# Patient Record
Sex: Female | Born: 1995 | Race: White | Hispanic: No | Marital: Single | Smoking: Never smoker
Health system: Southern US, Community
[De-identification: ages and names within clinical notes are randomized; demographics above are authoritative.]

## PROBLEM LIST (undated history)

## (undated) DIAGNOSIS — R7303 Prediabetes: Secondary | ICD-10-CM

## (undated) HISTORY — DX: Prediabetes: R73.03

---
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
## 2014-06-03 ENCOUNTER — Emergency Department (HOSPITAL_COMMUNITY)
Admission: EM | Admit: 2014-06-03 | Discharge: 2014-06-04 | Disposition: A | Discharge status: Home / Self Care | Payer: Medicaid Other | Attending: Emergency Medicine | Admitting: Emergency Medicine

## 2014-06-03 ENCOUNTER — Encounter (HOSPITAL_COMMUNITY): Payer: Self-pay | Admitting: Emergency Medicine

## 2014-06-03 DIAGNOSIS — J069 Acute upper respiratory infection, unspecified: Secondary | ICD-10-CM | POA: Insufficient documentation

## 2014-06-03 LAB — RAPID STREP SCREEN (MED CTR MEBANE ONLY): STREPTOCOCCUS, GROUP A SCREEN (DIRECT): NEGATIVE

## 2014-06-03 MED ORDER — BENZONATATE 100 MG PO CAPS: 100.0000 mg | ORAL_CAPSULE | Freq: Three times a day (TID) | ORAL | Status: DC | PRN

## 2014-06-03 NOTE — ED Provider Notes (Signed)
CSN: [id number]     Arrival date & time 06/03/14  2139 History   First MD Initiated Contact with Patient 06/03/14 2243     Chief Complaint  Patient presents with  . Sore Throat      HPI Pt was seen at 2240.  Per pt, c/o gradual onset and persistence of constant sore throat, runny/stuffy nose, sinus congestion, and cough for the past 2-3 days.  Multiple others in household with same symptoms. Denies objective fever, no rash, no CP/SOB, no N/V/D, no abd pain.     History reviewed. No pertinent past medical history.  History reviewed. No pertinent past surgical history.  History  Substance Use Topics  . Smoking status: Never Smoker   . Smokeless tobacco: Not on file  . Alcohol Use: No    Review of Systems ROS: Statement: All systems negative except as marked or noted in the HPI; Constitutional: Negative for fever and +chills. ; ; Eyes: Negative for eye pain, redness and discharge. ; ; ENMT: Negative for ear pain, hoarseness, +nasal congestion, sinus pressure and sore throat. ; ; Cardiovascular: Negative for chest pain, palpitations, diaphoresis, dyspnea and peripheral edema. ; ; Respiratory: +cough. Negative for wheezing and stridor. ; ; Gastrointestinal: Negative for nausea, vomiting, diarrhea, abdominal pain, blood in stool, hematemesis, jaundice and rectal bleeding. . ; ; Genitourinary: Negative for dysuria, flank pain and hematuria. ; ; Musculoskeletal: Negative for back pain and neck pain. Negative for swelling and trauma.; ; Skin: Negative for pruritus, rash, abrasions, blisters, bruising and skin lesion.; ; Neuro: Negative for headache, lightheadedness and neck stiffness. Negative for weakness, altered level of consciousness , altered mental status, extremity weakness, paresthesias, involuntary movement, seizure and syncope.        Allergies  Review of patient's allergies indicates no known allergies.  Home Medications   Prior to Admission medications   Medication Sig Start  Date End Date Taking? Authorizing Provider  dextromethorphan-guaiFENesin (MUCINEX DM) 30-600 MG per 12 hr tablet Take 1 tablet by mouth daily as needed for cough.   Yes Historical Provider, MD  Triprolidine-Pseudoephedrine (COLD/ALLERGY/SINUS PO) Take 1-2 tablets by mouth daily as needed (for cold symptoms).   Yes Historical Provider, MD   BP 128/89  Pulse 92  Temp(Src) 97.9 F (36.6 C) (Oral)  Resp 24  Wt 232 lb 4.8 oz (105.371 kg)  SpO2 98%  LMP 05/13/2014 Physical Exam 2245: Physical examination:  Nursing notes reviewed; Vital signs and O2 SAT reviewed;  Constitutional: Well developed, Well nourished, Well hydrated, In no acute distress; Head:  Normocephalic, atraumatic; Eyes: EOMI, PERRL, No scleral icterus; ENMT: TM's clear bilat. +edemetous nasal turbinates bilat with clear rhinorrhea. Mouth and pharynx without lesions. +mild erythema to posterior pharynx with post nasal drip. No tonsillar exudates. No intra-oral edema. No submandibular or sublingual edema. No hoarse voice, no drooling, no stridor. No pain with manipulation of larynx. No trismus. Mouth and pharynx normal, Mucous membranes moist; Neck: Supple, Full range of motion, No lymphadenopathy; Cardiovascular: Regular rate and rhythm, No murmur, rub, or gallop; Respiratory: Breath sounds clear & equal bilaterally, No rales, rhonchi, wheezes.  Speaking full sentences with ease, Normal respiratory effort/excursion; Chest: Nontender, Movement normal; Abdomen: Soft, Nontender, Nondistended, Normal bowel sounds; Genitourinary: No CVA tenderness; Extremities: Pulses normal, No tenderness, No edema, No calf edema or asymmetry.; Neuro: AA&Ox3, Major CN grossly intact.  Speech clear. No gross focal motor or sensory deficits in extremities.; Skin: Color normal, Warm, Dry.   ED Course  Procedures  MDM  MDM Reviewed: previous chart, nursing note and vitals Interpretation: labs    Results for orders placed during the hospital encounter  of 06/03/14  RAPID STREP SCREEN      Result Value Ref Range   Streptococcus, Group A Screen (Direct) NEGATIVE  NEGATIVE     2340:  Strep test negative. Will tx symptomatically at this time. Dx and testing d/w pt.  Questions answered.  Verb understanding, agreeable to d/c home with outpt f/u.     Laray AngerKathleen M Shenoa Hattabaugh, DO 06/06/14 1614

## 2014-06-03 NOTE — Discharge Instructions (Signed)
°Emergency Department Resource Guide °1) Find a Doctor and Pay Out of Pocket °Although you won't have to find out who is covered by your insurance plan, it is a good idea to ask around and get recommendations. You will then need to call the office and see if the doctor you have chosen will accept you as a new patient and what types of options they offer for patients who are self-pay. Some doctors offer discounts or will set up payment plans for their patients who do not have insurance, but you will need to ask so you aren't surprised when you get to your appointment. ° °2) Contact Your Local Health Department °Not all health departments have doctors that can see patients for sick visits, but many do, so it is worth a call to see if yours does. If you don't know where your local health department is, you can check in your phone book. The CDC also has a tool to help you locate your state's health department, and many state websites also have listings of all of their local health departments. ° °3) Find a Walk-in Clinic °If your illness is not likely to be very severe or complicated, you may want to try a walk in clinic. These are popping up all over the country in pharmacies, drugstores, and shopping centers. They're usually staffed by nurse practitioners or physician assistants that have been trained to treat common illnesses and complaints. They're usually fairly quick and inexpensive. However, if you have serious medical issues or chronic medical problems, these are probably not your best option. ° °No Primary Care Doctor: °- Call [facility] Connect at  832-8000 - they can help you locate a primary care doctor that  accepts your insurance, provides certain services, etc. °- Physician Referral Service- 1-800-533-3463 ° °Chronic Pain Problems: °Organization         Address  Phone   Notes  °[facility]  (336) [phone] Patients need to be referred by their primary care doctor.  ° °Medication  Assistance: °Organization         Address  Phone   Notes  °[place] [facility] [address] °[place], [place] [address] (336) 641-8030 --Must be a resident of [place] °-- Must have NO insurance coverage whatsoever (no Medicaid/ Medicare, etc.) °-- The pt. MUST have a primary care doctor that directs their care regularly and follows them in the community °  °[facility]  (866) 331-1348   °[employer]  (888) 892-1162   ° °Agencies that provide inexpensive medical care: °Organization         Address  Phone   Notes  °[facility]  (336) 832-8035   °[facility]    (336) 832-7272   °[facility] [address] °[place], [place] [address] (336) 832-4777   °[facility] [address], °[place] (336) 271-4999   °[facility]    (336) 373-0678   °[facility]    (336) 272-1050   °[facility] ° [address], [place] Phone:  (336) 832-4444, Fax:  (336) 832-4440 Hours of Operation:  9 am - 6 pm, M-F.  Also accepts Medicaid/Medicare and self-pay.  °[facility] ° [address], [place] Phone: (336) 832-3150, Fax: (336) [phone]. Hours of Operation:  8:30 am - 5:30 pm, M-F.  Also accepts Medicaid and self-pay.  °[facility] [place] [address], [place] Phone: (336) 878-6027   °[facility] [address], [place], [place] (336)723-1848, Ext. 123 Mondays & Thursdays: 7-9 AM.  First 15 patients are seen on a first come, first serve basis. °  ° °Medicaid-accepting [place] Providers: ° °Organization         Address  Phone   Notes  °[facility] [address], [place] (336) 641-2100 Also accepts self-pay patients.  °[facility] [address], [place] ° (336) 856-9996   °[facility] [address], [place]  (336) 288-8857   °[facility] [address] [place] [address], [place] (336) 299-7000   °Veita Bland [address], [place]  ° (336) 373-1557 Only accepts [place] Access Medicaid patients after they have their name applied to their card.  ° °Self-Pay (no insurance) in [place]: ° °Organization         Address  Phone   Notes  °Sickle Cell Patients, [facility] [address], [place] (336) 832-1970   °[facility] [address], [place] (336) 832-4400   °[facility] ° [address], [place] (336) 992-4800   °[facility]/Dr. Osei-Bonsu ° [address] [place] [address], [place] or [address], [place] (336) [phone] Phone number for both [place] and [place] locations is the same.  °[facility] [address], [place] (336) 299-0000   °[facility] [address] [place] [address], [place] or [address] (336) 852-7530 °(336) 878-2260   °[facility] [address], [place] (336) 350-1642, phone; (336) 294-5005, fax Sees patients 1st and 3rd Saturday of every month.  Must not qualify for public or private insurance (i.e. Medicaid, Medicare, [place] Health Choice, Veterans' Benefits) • Household income should be no more than 200% of the poverty level •The clinic cannot treat you if you are pregnant or think you are pregnant • Sexually transmitted diseases are not treated at the clinic.  ° ° °Dental Care: °Organization         Address  Phone  Notes  °[place] [facility] [address], [place] (336) 641-6152 Accepts children up to age 21 who are enrolled in Medicaid or [place] Health Choice; pregnant women with a Medicaid card; and children who have applied for Medicaid or [place] Health Choice, but were declined, whose parents can pay a reduced fee at time of service.  °[place]  [facility] [place]  [address], [place] (336) 641-7733 Accepts children up to age 21 who are enrolled in Medicaid or [place] Health Choice; pregnant women with a Medicaid card; and children who have applied for Medicaid or [place] Health Choice, but were declined, whose parents can pay a reduced fee at time of service.  °[facility] ° [address], [place] (336) 641-4533 Patients are seen by appointment only. Walk-ins are not accepted. [facility] will see patients 18 years of age and older. °Monday - Tuesday (8am-5pm) °Most Wednesdays (8:30-5pm) °$30 per visit, cash only  °[facility] ° [address], [place] (336) 641-4533 Patients are seen by appointment only. Walk-ins are not accepted. [facility] will see patients 18 years of age and older. °One   Wednesday Evening (Monthly: Volunteer Based).  $30 per visit, cash only  °[facility]  (919) 537-3737 for adults; Children under age 4, call Graduate Pediatric Dentistry at ([phone]) [phone]. Children aged 4-14, please call (919) [phone] to request a pediatric application. ° Dental services are provided in all areas of dental care including fillings, crowns and bridges, complete and partial dentures, implants, gum treatment, root canals, and extractions. Preventive care is also provided. Treatment is provided to both adults and children. °Patients are selected via a lottery and there is often a waiting list. °  °[facility] [address], °[place] ° (336) 763-8833 [email or web address] °  °[facility] [address], [place], [place] (336)723-1848, Ext. 123 Second and Fourth Thursday of each month, opens at 6:30 AM; Clinic ends at 9 AM.  Patients are seen on a first-come first-served basis, and a limited number are seen during each clinic.  ° °[facility] ° [address], [place], [place] (336) 723-7904    Eligibility Requirements °You must have lived in [place], [place], or [place] counties for at least the last three months. °  You cannot be eligible for state or federal sponsored healthcare insurance, including Veterans Administration, Medicaid, or Medicare. °  You generally cannot be eligible for healthcare insurance through your employer.  °  How to apply: °Eligibility screenings are held every Tuesday and Wednesday afternoon from 1:00 pm until 4:00 pm. You do not need an appointment for the interview!  °[facility] [address], [place], [place] 336-631-2330   °[facility]  336-342-8273   °[facility]  336-703-3100   °[facility]  336-570-6415   ° °Behavioral Health Resources in the Community: °Intensive Outpatient Programs °Organization         Address  Phone  Notes  °[facility] [address], [place], [place] 336-878-6098   °[facility] [address], [place], [place] 336-832-9800   °[facility] [address], [place], [place] ° 336-882-2125   °[facility] [address],  °[place], [place] 1-800-853-5163 or 336-641-4981   °Substance Abuse Resources °Organization         Address  Phone  Notes  °[facility]  336-882-2125   °[facility]  336-784-9470   °[facility]  336-285-9073   °[facility]  336-845-3988   °Residential & Outpatient Substance Abuse Program  1-800-659-3381   °Psychological Services °Organization         Address  Phone  Notes  °[facility]  336- 832-9600   °[facility]  336- 378-7881   °[facility] [address], [place] 1-800-853-5163 or 336-641-4981   ° °Mobile Crisis Teams °Organization         Address  Phone  Notes  °[facility], Mobile Crisis Care Unit  1-877-626-1772   °Assertive °[facility] ° [address]  [place], [place] 336-834-9664   °Sharon DeEsch [address] °[place] 336-554-5454   ° °Self-Help/Support Groups °Organization         Address  Phone             Notes  °[facility] of  - variety of support groups  336- 373-1402 Call for more information  °Narcotics Anonymous (NA), [facility] [address], °[place]  2 meetings at this location  ° °  Residential Treatment Programs Organization         Address  Phone  Notes  [facility] [address],    [place]  1-610-960-45401-(313) 790-6672   [facility]  [address], [place] [phone], [place], [place] [phone]   [facility] [address], [place] [phone] Admissions: 8am-3pm M-F  [facility] [address]    [place], [place] [phone]([phone])[phone]   [facility] [address], [place], [place] [phone]   [facility] [address]  [place], [place] [phone]   [facility] Int[facility]patient [address], [place], [place] [phone]   [facility] [address]  [place], [place] [phone] or [phone]   [facility] [address] [place], [place] 323-557-3220([phone]) [phone] Accepts Medicaid  [facility] [address]  [place] [phone] Substance Abuse/Addiction Treatment   [facility] Resources Organization         Address  Phone  Notes  [facility]  864-775-4903(888) [phone]   Angie FavaJulie Brannon, PhD [address], [address] [place], [place]   [phone](336) [phone] or [phone](336) [phone]   [facility]    [address] [place], [place] [phone](336) [phone]   [facility] [address], [place], [place] [phone](336) [phone] Insurance/Medicaid/sponsorship through [facility]  [facility] [address]                                    [place], [place] [phone](336) [phone] Therapy/tele-psych/case    [facility] [address]   [place], [place] [phone]) [phone]    Dr. Lolly MustacheArfeen  [phone](336) [phone]   [facility]  [employer] [facility] 1) [address], [place] 2) [address], [place] 3)  [address], [place] [phone](336) [phone](336) [phone]  [phone](336) [phone]   [facility] Child Abuse Hotline 403-420-0891(336) 7431369280 or [phone](336) [phone] (After Hours)      Take over the counter tylenol and ibuprofen, as directed on packaging, as needed for discomfort.  Gargle with warm water several times per day to help with discomfort.  May also use over the counter sore throat pain medicines such as chloraseptic or sucrets, as directed on packaging, as needed for discomfort.  Take over the counter decongestant (such as sudafed), as directed on packaging, for the next week.  Use over the counter normal saline nasal spray, as instructed in the Emergency Department, several times per day for the next 2 weeks.  Call your regular medical doctor tomorrow to schedule a follow up appointment this week.  Return to the Emergency Department immediately if worsening.

## 2014-06-03 NOTE — ED Notes (Signed)
Patient c/o generalized body aches, cough, and sore throat x 3 days.

## 2014-06-04 NOTE — ED Notes (Signed)
Pt left without signing discharge or receiving prescriptions or discharge instructions.

## 2014-06-05 LAB — CULTURE, GROUP A STREP

## 2014-06-06 ENCOUNTER — Emergency Department (HOSPITAL_COMMUNITY)
Admission: EM | Admit: 2014-06-06 | Discharge: 2014-06-06 | Disposition: A | Discharge status: Home / Self Care | Payer: Medicaid Other | Attending: Emergency Medicine | Admitting: Emergency Medicine

## 2014-06-06 ENCOUNTER — Encounter (HOSPITAL_COMMUNITY): Payer: Self-pay | Admitting: Emergency Medicine

## 2014-06-06 DIAGNOSIS — Z791 Long term (current) use of non-steroidal anti-inflammatories (NSAID): Secondary | ICD-10-CM | POA: Insufficient documentation

## 2014-06-06 DIAGNOSIS — H6691 Otitis media, unspecified, right ear: Secondary | ICD-10-CM

## 2014-06-06 DIAGNOSIS — Z79899 Other long term (current) drug therapy: Secondary | ICD-10-CM | POA: Insufficient documentation

## 2014-06-06 DIAGNOSIS — H6690 Otitis media, unspecified, unspecified ear: Secondary | ICD-10-CM | POA: Insufficient documentation

## 2014-06-06 DIAGNOSIS — Z792 Long term (current) use of antibiotics: Secondary | ICD-10-CM | POA: Insufficient documentation

## 2014-06-06 DIAGNOSIS — H109 Unspecified conjunctivitis: Secondary | ICD-10-CM | POA: Insufficient documentation

## 2014-06-06 MED ORDER — TOBRAMYCIN 0.3 % OP SOLN
2.0000 [drp] | Freq: Once | OPHTHALMIC | Status: AC
  Administered 2014-06-06: 2 [drp] via OPHTHALMIC
  Filled 2014-06-06: qty 5

## 2014-06-06 MED ORDER — IBUPROFEN 800 MG PO TABS: 800.0000 mg | ORAL_TABLET | Freq: Three times a day (TID) | ORAL | Status: DC

## 2014-06-06 MED ORDER — IBUPROFEN 800 MG PO TABS
800.0000 mg | ORAL_TABLET | Freq: Once | ORAL | Status: AC
  Administered 2014-06-06: 800 mg via ORAL
  Filled 2014-06-06: qty 1

## 2014-06-06 MED ORDER — HYDROCODONE-ACETAMINOPHEN 5-325 MG PO TABS
1.0000 | ORAL_TABLET | Freq: Once | ORAL | Status: AC
  Administered 2014-06-06: 1 via ORAL
  Filled 2014-06-06: qty 1

## 2014-06-06 MED ORDER — PENICILLIN V POTASSIUM 250 MG PO TABS
500.0000 mg | ORAL_TABLET | Freq: Once | ORAL | Status: AC
  Administered 2014-06-06: 500 mg via ORAL
  Filled 2014-06-06: qty 2

## 2014-06-06 MED ORDER — AMOXICILLIN 500 MG PO CAPS: 500.0000 mg | ORAL_CAPSULE | Freq: Three times a day (TID) | ORAL | Status: DC

## 2014-06-06 MED ORDER — ONDANSETRON HCL 4 MG PO TABS
4.0000 mg | ORAL_TABLET | Freq: Once | ORAL | Status: AC
  Administered 2014-06-06: 4 mg via ORAL
  Filled 2014-06-06: qty 1

## 2014-06-06 MED ORDER — HYDROCODONE-ACETAMINOPHEN 5-325 MG PO TABS: 1.0000 | ORAL_TABLET | ORAL | Status: DC | PRN

## 2014-06-06 NOTE — ED Provider Notes (Signed)
CSN: [id number]     Arrival date & time 06/06/14  2143 History   First MD Initiated Contact with Patient 06/06/14 2149     Chief Complaint  Patient presents with  . Otalgia     (Consider location/radiation/quality/duration/timing/severity/associated sxs/prior Treatment) Patient is a 18 y.o. female presenting with ear pain. The history is provided by the patient.  Otalgia Location:  Right Behind ear:  No abnormality Quality:  Aching and pressure Severity:  Moderate Onset quality:  Gradual Timing:  Intermittent Progression:  Worsening Chronicity:  Recurrent Context: not direct blow and no water in ear   Relieved by:  Nothing Ineffective treatments:  None tried Associated symptoms: headaches   Associated symptoms: no abdominal pain, no cough, no fever, no neck pain and no vomiting   Risk factors: no recent travel     History reviewed. No pertinent past medical history. History reviewed. No pertinent past surgical history. History reviewed. No pertinent family history. History  Substance Use Topics  . Smoking status: Never Smoker   . Smokeless tobacco: Not on file  . Alcohol Use: No   OB History   Grav Para Term Preterm Abortions TAB SAB Ect Mult Living                 Review of Systems  Constitutional: Negative for fever and activity change.       All ROS Neg except as noted in HPI  HENT: Positive for ear pain. Negative for nosebleeds.   Eyes: Negative for photophobia and discharge.  Respiratory: Negative for cough, shortness of breath and wheezing.   Cardiovascular: Negative for chest pain and palpitations.  Gastrointestinal: Negative for vomiting, abdominal pain and blood in stool.  Genitourinary: Negative for dysuria, frequency and hematuria.  Musculoskeletal: Negative for arthralgias, back pain and neck pain.  Skin: Negative.   Neurological: Positive for headaches. Negative for dizziness, seizures and speech difficulty.  Psychiatric/Behavioral: Negative for  hallucinations and confusion.      Allergies  Review of patient's allergies indicates no known allergies.  Home Medications   Prior to Admission medications   Medication Sig Start Date End Date Taking? Authorizing Provider  dextromethorphan-guaiFENesin (MUCINEX DM) 30-600 MG per 12 hr tablet Take 1 tablet by mouth daily as needed for cough.   Yes Historical Provider, MD  DM-Doxylamine-Acetaminophen 15-6.25-325 MG/15ML LIQD Take 10-15 mLs by mouth at bedtime as needed (for cold).   Yes Historical Provider, MD  Triprolidine-Pseudoephedrine (COLD/ALLERGY/SINUS PO) Take 1-2 tablets by mouth daily as needed (for cold symptoms).   Yes Historical Provider, MD  amoxicillin (AMOXIL) 500 MG capsule Take 1 capsule (500 mg total) by mouth 3 (three) times daily. 06/06/14   Kathie DikeHobson M Zyah Gomm, PA-C  benzonatate (TESSALON) 100 MG capsule Take 1 capsule (100 mg total) by mouth 3 (three) times daily as needed for cough. 06/03/14   Laray AngerKathleen M McManus, DO  HYDROcodone-acetaminophen (NORCO/VICODIN) 5-325 MG per tablet Take 1 tablet by mouth every 4 (four) hours as needed for moderate pain. 06/06/14   Kathie DikeHobson M Antawan Mchugh, PA-C  ibuprofen (ADVIL,MOTRIN) 800 MG tablet Take 1 tablet (800 mg total) by mouth 3 (three) times daily. 06/06/14   Kathie DikeHobson M Tyreke Kaeser, PA-C   BP 126/80  Pulse 98  Temp(Src) 97.6 F (36.4 C) (Oral)  Resp 20  Ht 5\' 4"  (1.626 m)  Wt 232 lb (105.235 kg)  BMI 39.80 kg/m2  LMP 05/13/2014 Physical Exam  Nursing note and vitals reviewed. Constitutional: She is oriented to person, place, and time. She appears  well-developed and well-nourished.  Non-toxic appearance.  HENT:  Head: Normocephalic.  Right Ear: Tympanic membrane and external ear normal.  Left Ear: Tympanic membrane and external ear normal.  Patient has a cerumen impaction on the right. There is a partial cerumen impaction on left, with portion of the tympanic membrane seen is without problem. The extra auditory canals are clear.  There is  mild increased redness of the posterior pharynx. The uvula is in the midline. The speech is understandable.  There is nasal congestion present.  Eyes: EOM and lids are normal. Pupils are equal, round, and reactive to light.  There is increased redness of the conjunctiva of the right and the left eyes. There is some swelling of the conjunctiva of the lower lid.  Neck: Normal range of motion. Neck supple. Carotid bruit is not present.  Cardiovascular: Normal rate, regular rhythm, normal heart sounds, intact distal pulses and normal pulses.   Pulmonary/Chest: Breath sounds normal. No respiratory distress.  Abdominal: Soft. Bowel sounds are normal. There is no tenderness. There is no guarding.  Musculoskeletal: Normal range of motion.  Lymphadenopathy:       Head (right side): No submandibular adenopathy present.       Head (left side): No submandibular adenopathy present.    She has no cervical adenopathy.  Neurological: She is alert and oriented to person, place, and time. She has normal strength. No cranial nerve deficit or sensory deficit.  Skin: Skin is warm and dry.  Psychiatric: She has a normal mood and affect. Her speech is normal.    ED Course The right ear was irrigated by me with removal of significant  wax impaction.  After removal of the ear wax impaction, there is noted redness and some bulging of the tympanic membrane.   Procedures (including critical care time) Labs Review Labs Reviewed - No data to display  Imaging Review No results found.   EKG Interpretation None      MDM Vital signs are WNL. Exam is consistent with otitis media and conjunctivitis. Pt will be treated with tobramycin opthal drops, and amoxil, and norco. Pt to follow up with PCP.   Final diagnoses:  Acute right otitis media, recurrence not specified, unspecified otitis media type  Bilateral conjunctivitis    *I have reviewed nursing notes, vital signs, and all appropriate lab and imaging  results for this patient.Kathie Dike**    Verlene Glantz M Willie Loy, PA-C 06/08/14 0110

## 2014-06-06 NOTE — Discharge Instructions (Signed)
You have an ear infection on the right. Please use Amoxil and ibuprofen 3 times daily with food until all taken. Please use 2 tobramycin eyedrops every 4 hours for the next 5 days for your conjunctivitis. Please wash hands frequently. Please use dark glasses when in bright light for comfort. Use Norco for pain. This medication may cause drowsiness, please use with caution. Otitis Media Otitis media is redness, soreness, and swelling (inflammation) of the middle ear. Otitis media may be caused by allergies or, most commonly, by infection. Often it occurs as a complication of the common cold. SIGNS AND SYMPTOMS Symptoms of otitis media may include:  Earache.  Fever.  Ringing in your ear.  Headache.  Leakage of fluid from the ear. DIAGNOSIS To diagnose otitis media, your health care provider will examine your ear with an otoscope. This is an instrument that allows your health care provider to see into your ear in order to examine your eardrum. Your health care provider also will ask you questions about your symptoms. TREATMENT  Typically, otitis media resolves on its own within 3-5 days. Your health care provider may prescribe medicine to ease your symptoms of pain. If otitis media does not resolve within 5 days or is recurrent, your health care provider may prescribe antibiotic medicines if he or she suspects that a bacterial infection is the cause. HOME CARE INSTRUCTIONS   Take your medicine as directed until it is gone, even if you feel better after the first few days.  Only take over-the-counter or prescription medicines for pain, discomfort, or fever as directed by your health care provider.  Follow up with your health care provider as directed. SEEK MEDICAL CARE IF:  You have otitis media only in one ear, or bleeding from your nose, or both.  You notice a lump on your neck.  You are not getting better in 3-5 days.  You feel worse instead of better. SEEK IMMEDIATE MEDICAL CARE  IF:   You have pain that is not controlled with medicine.  You have swelling, redness, or pain around your ear or stiffness in your neck.  You notice that part of your face is paralyzed.  You notice that the bone behind your ear (mastoid) is tender when you touch it. MAKE SURE YOU:   Understand these instructions.  Will watch your condition.  Will get help right away if you are not doing well or get worse. Document Released: 09/10/2004 Document Revised: 12/11/2013 Document Reviewed: 07/03/2013 [facility]Exit[facility] Patient Information 2015 [place], [place]. This information is not intended to replace advice given to you by your health care provider. Make sure you discuss any questions you have with your health care provider.  Conjunctivitis Conjunctivitis is commonly called "pink eye." Conjunctivitis can be caused by bacterial or viral infection, allergies, or injuries. There is usually redness of the lining of the eye, itching, discomfort, and sometimes discharge. There may be deposits of matter along the eyelids. A viral infection usually causes a watery discharge, while a bacterial infection causes a yellowish, thick discharge. Pink eye is very contagious and spreads by direct contact. You may be given antibiotic eyedrops as part of your treatment. Before using your eye medicine, remove all drainage from the eye by washing gently with warm water and cotton balls. Continue to use the medication until you have awakened 2 mornings in a row without discharge from the eye. Do not rub your eye. This increases the irritation and helps spread infection. Use separate towels from other household  members. Wash your hands with soap and water before and after touching your eyes. Use cold compresses to reduce pain and sunglasses to relieve irritation from light. Do not wear contact lenses or wear eye makeup until the infection is gone. SEEK MEDICAL CARE IF:   Your symptoms are not better after 3 days of  treatment.  You have increased pain or trouble seeing.  The outer eyelids become very red or swollen. Document Released: 01/13/2005 Document Revised: 02/28/2012 Document Reviewed: 12/06/2005 [facility]ExitCare Patient Information 2015 [place]Care, [place]LL[place]. This information is not intended to replace advice given to you by your health care provider. Make sure you discuss any questions you have with your health care provider.

## 2014-06-06 NOTE — ED Notes (Signed)
My ear is hurting and my eyes are hurting per pt.

## 2014-06-12 NOTE — ED Provider Notes (Signed)
Medical screening examination/treatment/procedure(s) were performed by non-physician practitioner and as supervising physician I was immediately available for consultation/collaboration.   EKG Interpretation None      Devoria AlbeIva Athziri Freundlich, MD, Armando GangFACEP   Ward GivensIva L Milind Raether, MD 06/12/14 [phone]

## 2014-12-25 ENCOUNTER — Encounter (HOSPITAL_COMMUNITY): Payer: Self-pay | Admitting: Emergency Medicine

## 2014-12-25 ENCOUNTER — Emergency Department (HOSPITAL_COMMUNITY)
Admission: EM | Admit: 2014-12-25 | Discharge: 2014-12-25 | Disposition: A | Discharge status: Home / Self Care | Payer: Medicaid Other | Attending: Emergency Medicine | Admitting: Emergency Medicine

## 2014-12-25 DIAGNOSIS — L42 Pityriasis rosea: Secondary | ICD-10-CM | POA: Diagnosis not present

## 2014-12-25 DIAGNOSIS — Z792 Long term (current) use of antibiotics: Secondary | ICD-10-CM | POA: Diagnosis not present

## 2014-12-25 DIAGNOSIS — R21 Rash and other nonspecific skin eruption: Secondary | ICD-10-CM | POA: Diagnosis present

## 2014-12-25 DIAGNOSIS — Z791 Long term (current) use of non-steroidal anti-inflammatories (NSAID): Secondary | ICD-10-CM | POA: Insufficient documentation

## 2014-12-25 MED ORDER — PREDNISONE 20 MG PO TABS: ORAL_TABLET | ORAL | Status: DC

## 2014-12-25 NOTE — ED Notes (Signed)
Patient states she has rash after wearing a new outfit she received for Christmas.   Patient states has had the rash x 1 week, but spreading.   Patient complains of raised, red rash over stomach, chest, neck and arm.

## 2014-12-25 NOTE — ED Provider Notes (Signed)
CSN: [id number]     Arrival date & time 12/25/14  1257 History  This chart was scribed for non-physician practitioner, Junius Finner, PA-C working with Glynn Octave, MD by Gwenyth Ober, ED scribe. This patient was seen in room TR09C/TR09C and the patient's care was started at 1:30 PM   Chief Complaint  Patient presents with  . Rash   The history is provided by the patient. No language interpreter was used.   HPI Comments: Heidi Turner is a 19 y.o. female who presents to the Emergency Department complaining of a constant, gradually worsening rash that started 1 week ago and is worst on her chest and neck. Pt notes intermittent itching an associated symptom. Her stepfather states the rash is becoming more vivid and is spreading to her stomach and arms. Pt denies new soaps, medicines and lotions. She denies a history of diabetes. Pt also denies fever as an associated symptom.  History reviewed. No pertinent past medical history. History reviewed. No pertinent past surgical history. No family history on file. History  Substance Use Topics  . Smoking status: Never Smoker   . Smokeless tobacco: Not on file  . Alcohol Use: No   OB History    No data available     Review of Systems  Constitutional: Negative for fever.  Skin: Positive for rash.  All other systems reviewed and are negative.     Allergies  Review of patient's allergies indicates no known allergies.  Home Medications   Prior to Admission medications   Medication Sig Start Date End Date Taking? Authorizing Provider  amoxicillin (AMOXIL) 500 MG capsule Take 1 capsule (500 mg total) by mouth 3 (three) times daily. 06/06/14   Kathie Dike, PA-C  benzonatate (TESSALON) 100 MG capsule Take 1 capsule (100 mg total) by mouth 3 (three) times daily as needed for cough. 06/03/14   Samuel Jester, DO  dextromethorphan-guaiFENesin [facility] DM) 30-600 MG per 12 hr tablet Take 1 tablet by mouth daily as needed for cough.     Historical Provider, MD  DM-Doxylamine-Acetaminophen 15-6.25-325 MG/15ML LIQD Take 10-15 mLs by mouth at bedtime as needed (for cold).    Historical Provider, MD  HYDROcodone-acetaminophen (NORCO/VICODIN) 5-325 MG per tablet Take 1 tablet by mouth every 4 (four) hours as needed for moderate pain. 06/06/14   Kathie Dike, PA-C  ibuprofen (ADVIL,MOTRIN) 800 MG tablet Take 1 tablet (800 mg total) by mouth 3 (three) times daily. 06/06/14   Kathie Dike, PA-C  predniSONE (DELTASONE) 20 MG tablet 3 tabs po day one, then 2 po daily x 4 days 12/25/14   Junius Finner, PA-C  Triprolidine-Pseudoephedrine (COLD/ALLERGY/SINUS PO) Take 1-2 tablets by mouth daily as needed (for cold symptoms).    Historical Provider, MD   BP 125/97 mmHg  Pulse 81  Temp(Src) 97.2 F (36.2 C) (Oral)  Resp 18  SpO2 100% Physical Exam  Constitutional: She is oriented to person, place, and time. She appears well-developed and well-nourished.  HENT:  Head: Normocephalic and atraumatic.  Eyes: EOM are normal.  Neck: Normal range of motion.  Cardiovascular: Normal rate.   Pulmonary/Chest: Effort normal.  Musculoskeletal: Normal range of motion.  Neurological: She is alert and oriented to person, place, and time.  Skin: Skin is warm and dry. Rash noted.  A diffuse red, patchy, scaly rash on trunk and back; rash is not present on some exposed skin of arms or legs; no induration or fluctuance; no evidence of underlying infection   Psychiatric: She  has a normal mood and affect. Her behavior is normal.  Nursing note and vitals reviewed.   ED Course  Procedures (including critical care time) DIAGNOSTIC STUDIES: Oxygen Saturation is 100% on RA, normal by my interpretation.    COORDINATION OF CARE: 1:34 PM Discussed treatment plan with pt at bedside and pt agreed to plan.  Labs Review Labs Reviewed - No data to display  Imaging Review No results found.   EKG Interpretation None      MDM   Final diagnoses:   Pityriasis rosea    Pt presenting to ED with rash c/w pityriasis rosea.   No evidence of underlying infection. Pt is afebrile. No respiratory distress. Will tx with prednisone dose pack. Advised pt may use OTC benadryl as needed for itching. Home care instructions provided. Advised to f/u with PCP.  Return precautions provided. Pt verbalized understanding and agreement with tx plan.   I personally performed the services described in this documentation, which was scribed in my presence. The recorded information has been reviewed and is accurate.    Junius Finnerrin O'Malley, PA-C 12/25/14 1620  Glynn OctaveStephen Rancour, MD 12/25/14 [phone]

## 2014-12-25 NOTE — Discharge Instructions (Signed)
Pityriasis Rosea  Pityriasis rosea is a rash which is probably caused by a virus. It generally starts as a scaly, red patch on the trunk (the area of the body that a t-shirt would cover) but does not appear on sun exposed areas. The rash is usually preceded by an initial larger spot called the "herald patch" a week or more before the rest of the rash appears. Generally within one to two days the rash appears rapidly on the trunk, upper arms, and sometimes the upper legs. The rash usually appears as flat, oval patches of scaly pink color. The rash can also be raised and one is able to feel it with a finger. The rash can also be finely crinkled and may slough off leaving a ring of scale around the spot. Sometimes a mild sore throat is present with the rash. It usually affects children and young adults in the spring and autumn. Women are more frequently affected than men.  TREATMENT   Pityriasis rosea is a self-limited condition. This means it goes away within 4 to 8 weeks without treatment. The spots may persist for several months, especially in darker-colored skin after the rash has resolved and healed. Benadryl and steroid creams may be used if itching is a problem.  SEEK MEDICAL CARE IF:   · Your rash does not go away or persists longer than three months.  · You develop fever and joint pain.  · You develop severe headache and confusion.  · You develop breathing difficulty, vomiting and/or extreme weakness.  Document Released: 01/12/2002 Document Revised: 02/28/2012 Document Reviewed: 01/31/2009  ExitCare® Patient Information ©2015 Exit[employer], LLC. This information is not intended to replace advice given to you by your health care provider. Make sure you discuss any questions you have with your health care provider.

## 2015-07-03 ENCOUNTER — Encounter: Payer: Self-pay | Admitting: Certified Nurse Midwife

## 2015-07-03 ENCOUNTER — Ambulatory Visit (INDEPENDENT_AMBULATORY_CARE_PROVIDER_SITE_OTHER): Payer: Medicaid Other | Admitting: Certified Nurse Midwife

## 2015-07-03 VITALS — BP 126/82 | HR 94 | Temp 97.6°F | Ht 68.0 in | Wt 240.0 lb

## 2015-07-03 DIAGNOSIS — Z113 Encounter for screening for infections with a predominantly sexual mode of transmission: Secondary | ICD-10-CM | POA: Diagnosis not present

## 2015-07-03 DIAGNOSIS — N760 Acute vaginitis: Secondary | ICD-10-CM | POA: Diagnosis not present

## 2015-07-03 DIAGNOSIS — E669 Obesity, unspecified: Secondary | ICD-10-CM | POA: Diagnosis not present

## 2015-07-03 DIAGNOSIS — Z0000 Encounter for general adult medical examination without abnormal findings: Secondary | ICD-10-CM

## 2015-07-03 DIAGNOSIS — N926 Irregular menstruation, unspecified: Secondary | ICD-10-CM | POA: Diagnosis not present

## 2015-07-03 DIAGNOSIS — B9689 Other specified bacterial agents as the cause of diseases classified elsewhere: Secondary | ICD-10-CM

## 2015-07-03 DIAGNOSIS — E782 Mixed hyperlipidemia: Secondary | ICD-10-CM | POA: Diagnosis not present

## 2015-07-03 DIAGNOSIS — R7303 Prediabetes: Secondary | ICD-10-CM

## 2015-07-03 DIAGNOSIS — R7309 Other abnormal glucose: Secondary | ICD-10-CM | POA: Diagnosis not present

## 2015-07-03 DIAGNOSIS — Z01419 Encounter for gynecological examination (general) (routine) without abnormal findings: Secondary | ICD-10-CM | POA: Diagnosis not present

## 2015-07-03 DIAGNOSIS — A499 Bacterial infection, unspecified: Secondary | ICD-10-CM | POA: Diagnosis not present

## 2015-07-03 MED ORDER — TINIDAZOLE 500 MG PO TABS: 2.0000 g | ORAL_TABLET | Freq: Every day | ORAL | Status: AC

## 2015-07-03 MED ORDER — TINIDAZOLE 500 MG PO TABS: 2.0000 g | ORAL_TABLET | Freq: Every day | ORAL | Status: DC

## 2015-07-03 NOTE — Progress Notes (Signed)
Patient ID: Heidi Turner, female   DOB: 1996-09-15, 19 y.o.   MRN: [id number]    Subjective:     Heidi Turner is a 19 y.o. female here for a routine exam.  Current complaints: irregular, 1-2 months between.  Menses last are light to heavy, denies any clots.  Has not had a period for several months.  Abilify for depression, does not see a Veterinary surgeon.      Personal health questionnaire:  Is patient Ashkenazi Jewish, have a family history of breast and/or ovarian cancer: MGM BCA dx around early 52's.   Is there a family history of uterine cancer diagnosed at age < 11, gastrointestinal cancer, urinary tract cancer, family member who is a Personnel officer syndrome-associated carrier: no Is the patient overweight and hypertensive, family history of diabetes, personal history of gestational diabetes, preeclampsia or PCOS: yes Is patient over 67, have PCOS,  family history of premature CHD under age 108, diabetes, smoke, have hypertension or peripheral artery disease:  Mom has DM At any time, has a partner hit, kicked or otherwise hurt or frightened you?: no Over the past 2 weeks, have you felt down, depressed or hopeless?: yes on medication Over the past 2 weeks, have you felt little interest or pleasure in doing things?:sometimes   Gynecologic History No LMP recorded (lmp unknown). Patient is not currently having periods (Reason: Irregular Periods). Contraception: none Last Pap: N/A.  Last mammogram: N/A.  Obstetric History OB History  Gravida Para Term Preterm AB SAB TAB Ectopic Multiple Living         Past Medical History  Diagnosis Date  . Prediabetes     History reviewed. No pertinent past surgical history.   Current outpatient prescriptions:  .  amoxicillin (AMOXIL) 500 MG capsule, Take 1 capsule (500 mg total) by mouth 3 (three) times daily. (Patient not taking: Reported on 07/03/2015), Disp: 21 capsule, Rfl: 0 .  tinidazole (TINDAMAX) 500 MG tablet, Take 4  tablets (2,000 mg total) by mouth daily with breakfast., Disp: 12 tablet, Rfl: 0 No Known Allergies  History  Substance Use Topics  . Smoking status: Never Smoker   . Smokeless tobacco: Not on file  . Alcohol Use: No    History reviewed. No pertinent family history.    Review of Systems  Constitutional: negative for fatigue and weight loss Respiratory: negative for cough and wheezing Cardiovascular: negative for chest pain, fatigue and palpitations Gastrointestinal: negative for abdominal pain and change in bowel habits Musculoskeletal:negative for myalgias Neurological: negative for gait problems and tremors Behavioral/Psych: negative for abusive relationship, + hx of depression in medication Endocrine: negative for temperature intolerance   Genitourinary:negative for genital lesions, hot flashes, sexual problems and vaginal discharge.  +abnormal menstrual periods. Integument/breast: negative for breast lump, breast tenderness, nipple discharge and skin lesion(s)    Objective:       BP 126/82 mmHg  Pulse 94  Temp(Src) 97.6 F (36.4 C)  Ht  (1.727 m)  Wt 240 lb (108.863 kg)  BMI 36.50 kg/m2  LMP  (LMP Unknown) General:   alert  Skin:   no rash or abnormalities, hirsutism breasts/face  Lungs:   clear to auscultation bilaterally  Heart:   regular rate and rhythm, S1, S2 normal, no murmur, click, rub or gallop  Breasts:   normal without suspicious masses, skin or nipple changes or axillary nodes  Abdomen:  normal findings: no organomegaly, soft, non-tender and no hernia  obese  Pelvis:  External genitalia: normal general appearance Urinary system: urethral meatus normal and bladder without fullness, nontender Vaginal: normal without tenderness, induration or masses Cervix: no CMT Adnexa: normal bimanual exam Uterus: anteverted and non-tender, normal size   Lab Review Urine pregnancy test Labs reviewed yes Radiologic studies reviewed no  50% of 30 min visit  spent on counseling and coordination of care.   Assessment:    Healthy female exam.   Hirsutism Obesity Prediabetes BV Elevated triglycerides ?PCOS   Plan:    Education reviewed: depression evaluation, low fat, low cholesterol diet, safe sex/STD prevention, self breast exams, skin cancer screening and weight bearing exercise. Follow up in: 1 month.   Meds ordered this encounter  Medications  . DISCONTD: tinidazole (TINDAMAX) 500 MG tablet    Sig: Take 4 tablets (2,000 mg total) by mouth daily with breakfast.    Dispense:  12 tablet    Refill:  0  . tinidazole (TINDAMAX) 500 MG tablet    Sig: Take 4 tablets (2,000 mg total) by mouth daily with breakfast.    Dispense:  12 tablet    Refill:  0   Orders Placed This Encounter  Procedures  . SureSwab, Vaginosis/Vaginitis Plus  . US Transvaginal Non-OB    Standing Status: Future     Number of Occurrences:      Standing Expiration Date: 09/02/2016    Order Specific Question:  Reason for Exam (SYMPTOM  OR DIAGNOSIS REQUIRED)    Answer:  ?PCOS    Order Specific Question:  Preferred imaging location?    Answer:  Internal  . US Pelvis Complete    Standing Status: Future     Number of Occurrences:      Standing Expiration Date: 09/02/2016    Order Specific Question:  Reason for Exam (SYMPTOM  OR DIAGNOSIS REQUIRED)    Answer:  ?PCOS    Order Specific Question:  Preferred imaging location?    Answer:  Internal  . HIV antibody (with reflex)  . Hepatitis B surface antigen  . RPR  . Hepatitis C antibody  . TSH  . Prolactin  . Testosterone, Free, Total, SHBG  . 17-Hydroxyprogesterone  . Progesterone  . CBC with Differential/Platelet  . Comprehensive metabolic panel  . Referral to Nutrition and Diabetes Services    Referral Priority:  Routine    Referral Type:  Consultation    Referral Reason:  Specialty Services Required    Number of Visits Requested:  1   Need to obtain previous records Possible management options  include: contraception for PCOS

## 2015-07-04 LAB — CBC WITH DIFFERENTIAL/PLATELET
BASOS PCT: 0 % (ref 0–1)
Basophils Absolute: 0 10*3/uL (ref 0.0–0.1)
Eosinophils Absolute: 0.2 10*3/uL (ref 0.0–0.7)
Eosinophils Relative: 2 % (ref 0–5)
HEMATOCRIT: 43.3 % (ref 36.0–46.0)
HEMOGLOBIN: 14.3 g/dL (ref 12.0–15.0)
LYMPHS PCT: 26 % (ref 12–46)
Lymphs Abs: 3 10*3/uL (ref 0.7–4.0)
MCH: 28.1 pg (ref 26.0–34.0)
MCHC: 33 g/dL (ref 30.0–36.0)
MCV: 85.2 fL (ref 78.0–100.0)
MONO ABS: 0.9 10*3/uL (ref 0.1–1.0)
MPV: 10.2 fL (ref 8.6–12.4)
Monocytes Relative: 8 % (ref 3–12)
Neutro Abs: 7.4 10*3/uL (ref 1.7–7.7)
Neutrophils Relative %: 64 % (ref 43–77)
Platelets: 320 10*3/uL (ref 150–400)
RBC: 5.08 MIL/uL (ref 3.87–5.11)
RDW: 13.1 % (ref 11.5–15.5)
WBC: 11.6 10*3/uL — ABNORMAL HIGH (ref 4.0–10.5)

## 2015-07-04 LAB — COMPREHENSIVE METABOLIC PANEL
ALK PHOS: 84 U/L (ref 39–117)
ALT: 51 U/L — AB (ref 0–35)
AST: 22 U/L (ref 0–37)
Albumin: 4.1 g/dL (ref 3.5–5.2)
BUN: 12 mg/dL (ref 6–23)
CALCIUM: 9.6 mg/dL (ref 8.4–10.5)
CHLORIDE: 103 meq/L (ref 96–112)
CO2: 26 mEq/L (ref 19–32)
Creat: 0.75 mg/dL (ref 0.50–1.10)
Glucose, Bld: 105 mg/dL — ABNORMAL HIGH (ref 70–99)
POTASSIUM: 4 meq/L (ref 3.5–5.3)
SODIUM: 142 meq/L (ref 135–145)
TOTAL PROTEIN: 7 g/dL (ref 6.0–8.3)
Total Bilirubin: 0.3 mg/dL (ref 0.2–1.1)

## 2015-07-04 LAB — PROLACTIN: PROLACTIN: 6.7 ng/mL

## 2015-07-04 LAB — TESTOSTERONE, FREE, TOTAL, SHBG
Sex Hormone Binding: 19 nmol/L (ref 17–124)
TESTOSTERONE: 97 ng/dL — AB (ref 15–40)
Testosterone, Free: 23.6 pg/mL — ABNORMAL HIGH (ref 0.6–6.8)
Testosterone-% Free: 2.4 % (ref 0.4–2.4)

## 2015-07-04 LAB — HIV ANTIBODY (ROUTINE TESTING W REFLEX): HIV 1&2 Ab, 4th Generation: NONREACTIVE

## 2015-07-04 LAB — RPR

## 2015-07-04 LAB — HEPATITIS B SURFACE ANTIGEN: HEP B S AG: NEGATIVE

## 2015-07-04 LAB — HEPATITIS C ANTIBODY: HCV Ab: NEGATIVE

## 2015-07-04 LAB — TSH: TSH: 1.458 u[IU]/mL (ref 0.350–4.500)

## 2015-07-04 LAB — PROGESTERONE: Progesterone: 1 ng/mL

## 2015-07-07 LAB — 17-HYDROXYPROGESTERONE: 17-OH-PROGESTERONE, LC/MS/MS: 71 ng/dL

## 2015-07-07 LAB — SURESWAB, VAGINOSIS/VAGINITIS PLUS
ATOPOBIUM VAGINAE: 7.3 Log (cells/mL)
C. GLABRATA, DNA: NOT DETECTED
C. PARAPSILOSIS, DNA: NOT DETECTED
C. TRACHOMATIS RNA, TMA: NOT DETECTED
C. albicans, DNA: NOT DETECTED
C. tropicalis, DNA: NOT DETECTED
Gardnerella vaginalis: >8 Log (cells/mL)
LACTOBACILLUS SPECIES: NOT DETECTED Log (cells/mL)
N. gonorrhoeae RNA, TMA: NOT DETECTED
T. VAGINALIS RNA, QL TMA: NOT DETECTED

## 2015-07-08 ENCOUNTER — Other Ambulatory Visit: Payer: Self-pay | Admitting: Certified Nurse Midwife

## 2015-07-08 DIAGNOSIS — B9689 Other specified bacterial agents as the cause of diseases classified elsewhere: Secondary | ICD-10-CM

## 2015-07-08 DIAGNOSIS — N760 Acute vaginitis: Principal | ICD-10-CM

## 2015-07-08 MED ORDER — TINIDAZOLE 500 MG PO TABS: 2.0000 g | ORAL_TABLET | Freq: Every day | ORAL | Status: AC

## 2015-07-17 ENCOUNTER — Other Ambulatory Visit: Payer: Medicaid Other

## 2015-07-31 ENCOUNTER — Ambulatory Visit (INDEPENDENT_AMBULATORY_CARE_PROVIDER_SITE_OTHER): Payer: Medicaid Other | Admitting: Certified Nurse Midwife

## 2015-07-31 ENCOUNTER — Encounter: Payer: Self-pay | Admitting: Certified Nurse Midwife

## 2015-07-31 ENCOUNTER — Ambulatory Visit (INDEPENDENT_AMBULATORY_CARE_PROVIDER_SITE_OTHER): Payer: Medicaid Other

## 2015-07-31 VITALS — BP 115/78 | HR 84 | Temp 97.2°F | Ht 68.0 in | Wt 239.1 lb

## 2015-07-31 DIAGNOSIS — N926 Irregular menstruation, unspecified: Secondary | ICD-10-CM | POA: Diagnosis not present

## 2015-07-31 DIAGNOSIS — E282 Polycystic ovarian syndrome: Secondary | ICD-10-CM

## 2015-07-31 MED ORDER — METFORMIN HCL 500 MG PO TABS: 500.0000 mg | ORAL_TABLET | Freq: Two times a day (BID) | ORAL | Status: AC

## 2015-07-31 MED ORDER — TINIDAZOLE 500 MG PO TABS: 2.0000 g | ORAL_TABLET | Freq: Every day | ORAL | Status: DC

## 2015-07-31 MED ORDER — NORGESTIM-ETH ESTRAD TRIPHASIC 0.18/0.215/0.25 MG-25 MCG PO TABS: 1.0000 | ORAL_TABLET | Freq: Every day | ORAL | Status: AC

## 2015-07-31 MED ORDER — SPIRONOLACTONE 50 MG PO TABS: 50.0000 mg | ORAL_TABLET | Freq: Every day | ORAL | Status: AC

## 2015-07-31 MED ORDER — EFLORNITHINE HCL 13.9 % EX CREA: 1.0000 "application " | TOPICAL_CREAM | Freq: Two times a day (BID) | CUTANEOUS | Status: AC

## 2015-07-31 NOTE — Progress Notes (Signed)
Patient ID: Heidi Turner, female   DOB: July 03, 1996, 19 y.o.   MRN: [id number]   Chief Complaint  Patient presents with  . Follow-up    1 month    HPI Heidi Turner is a 19 y.o. female.  Here for f/u on Ultrasound and lab work.   Here with her mother for results.  HPI  Past Medical History  Diagnosis Date  . Prediabetes     History reviewed. No pertinent past surgical history.  History reviewed. No pertinent family history.  Social History Social History  Substance Use Topics  . Smoking status: Never Smoker   . Smokeless tobacco: None  . Alcohol Use: No    No Known Allergies  Current Outpatient Prescriptions  Medication Sig Dispense Refill  . amoxicillin (AMOXIL) 500 MG capsule Take 1 capsule (500 mg total) by mouth 3 (three) times daily. (Patient not taking: Reported on 07/03/2015) 21 capsule 0  . Eflornithine HCl (VANIQA) 13.9 % cream Apply 1 application topically 2 (two) times daily with a meal. 45 g 6  . metFORMIN (GLUCOPHAGE) 500 MG tablet Take 1 tablet (500 mg total) by mouth 2 (two) times daily with a meal. 60 tablet 12  . Norgestimate-Ethinyl Estradiol Triphasic (TRI-LO-SPRINTEC) 0.18/0.215/0.25 MG-25 MCG tab Take 1 tablet by mouth daily. 1 Package 11  . spironolactone (ALDACTONE) 50 MG tablet Take 1 tablet (50 mg total) by mouth daily. 30 tablet 12   No current facility-administered medications for this visit.    Review of Systems Review of Systems Constitutional: negative for fatigue and weight loss Respiratory: negative for cough and wheezing Cardiovascular: negative for chest pain, fatigue and palpitations Gastrointestinal: negative for abdominal pain and change in bowel habits Genitourinary:negative Integument/breast: negative for nipple discharge Musculoskeletal:negative for myalgias Neurological: negative for gait problems and tremors Behavioral/Psych: negative for abusive relationship, depression Endocrine: negative for temperature intolerance      Blood pressure 115/78, pulse 84, temperature 97.2 F (36.2 C), height 5\' 8"  (1.727 m), weight 239 lb 1.6 oz (108.455 kg).  Physical Exam Physical Exam General:   alert  Skin:   no rash or abnormalities  Lungs:   clear to auscultation bilaterally  Heart:   regular rate and rhythm, S1, S2 normal, no murmur, click, rub or gallop  Breasts:   normal without suspicious masses, skin or nipple changes or axillary nodes  Abdomen:  normal findings: no organomegaly, soft, non-tender and no hernia  Pelvis:  External genitalia: normal general appearance Urinary system: urethral meatus normal and bladder without fullness, nontender Vaginal: normal without tenderness, induration or masses Cervix: normal appearance Adnexa: normal bimanual exam Uterus: anteverted and non-tender, normal size    100% of 15 min visit spent on counseling and coordination of care.   Data Reviewed Previous medical hx, labs, meds  Assessment     Prediabetes PCOS Hirsutism  Contraception counseling    Plan    Orders Placed This Encounter  Procedures  . Hemoglobin A1c  . Cholesterol, total  . Triglycerides  . HDL cholesterol   Meds ordered this encounter  Medications  . DISCONTD: tinidazole (TINDAMAX) 500 MG tablet    Sig: Take 4 tablets (2,000 mg total) by mouth daily with breakfast.    Dispense:  12 tablet    Refill:  0  . metFORMIN (GLUCOPHAGE) 500 MG tablet    Sig: Take 1 tablet (500 mg total) by mouth 2 (two) times daily with a meal.    Dispense:  60 tablet    Refill:  12  . Norgestimate-Ethinyl Estradiol Triphasic (TRI-LO-SPRINTEC) 0.18/0.215/0.25 MG-25 MCG tab    Sig: Take 1 tablet by mouth daily.    Dispense:  1 Package    Refill:  11  . spironolactone (ALDACTONE) 50 MG tablet    Sig: Take 1 tablet (50 mg total) by mouth daily.    Dispense:  30 tablet    Refill:  12  . Eflornithine HCl (VANIQA) 13.9 % cream    Sig: Apply 1 application topically 2 (two) times daily with a meal.     Dispense:  45 g    Refill:  6    Follow up 3 mo. For PCOS

## 2015-07-31 NOTE — Patient Instructions (Signed)
Polycystic Ovarian Syndrome  Polycystic ovarian syndrome (PCOS) is a common hormonal disorder among women of reproductive age. Most women with PCOS grow many small cysts on their ovaries. PCOS can cause problems with your periods and make it difficult to get pregnant. It can also cause an increased risk of miscarriage with pregnancy. If left untreated, PCOS can lead to serious health problems, such as diabetes and heart disease.  CAUSES  The cause of PCOS is not fully understood, but genetics may be a factor.  SIGNS AND SYMPTOMS    Infrequent or no menstrual periods.    Inability to get pregnant (infertility) because of not ovulating.    Increased growth of hair on the face, chest, stomach, back, thumbs, thighs, or toes.    Acne, oily skin, or dandruff.    Pelvic pain.    Weight gain or obesity, usually carrying extra weight around the waist.    Type 2 diabetes.    High cholesterol.    High blood pressure.    Female-pattern baldness or thinning hair.    Patches of thickened and dark brown or black skin on the neck, arms, breasts, or thighs.    Tiny excess flaps of skin (skin tags) in the armpits or neck area.    Excessive snoring and having breathing stop at times while asleep (sleep apnea).    Deepening of the voice.    Gestational diabetes when pregnant.   DIAGNOSIS   There is no single test to diagnose PCOS.    Your health care provider will:    Take a medical history.    Perform a pelvic exam.    Have ultrasonography done.    Check your female and female hormone levels.    Measure glucose or sugar levels in the blood.    Do other blood tests.    If you are producing too many female hormones, your health care provider will make sure it is from PCOS. At the physical exam, your health care provider will want to evaluate the areas of increased hair growth. Try to allow natural hair growth for a few days before the visit.    During a pelvic exam, the ovaries may be  enlarged or swollen because of the increased number of small cysts. This can be seen more easily by using vaginal ultrasonography or screening to examine the ovaries and lining of the uterus (endometrium) for cysts. The uterine lining may become thicker if you have not been having a regular period.   TREATMENT   Because there is no cure for PCOS, it needs to be managed to prevent problems. Treatments are based on your symptoms. Treatment is also based on whether you want to have a baby or whether you need contraception.   Treatment may include:    Progesterone hormone to start a menstrual period.    Birth control pills to make you have regular menstrual periods.    Medicines to make you ovulate, if you want to get pregnant.    Medicines to control your insulin.    Medicine to control your blood pressure.    Medicine and diet to control your high cholesterol and triglycerides in your blood.   Medicine to reduce excessive hair growth.   Surgery, making small holes in the ovary, to decrease the amount of female hormone production. This is done through a long, lighted tube (laparoscope) placed into the pelvis through a tiny incision in the lower abdomen.   HOME CARE INSTRUCTIONS   Only   take over-the-counter or prescription medicine as directed by your health care provider.   Pay attention to the foods you eat and your activity levels. This can help reduce the effects of PCOS.   Keep your weight under control.   Eat foods that are low in carbohydrate and high in fiber.   Exercise regularly.  SEEK MEDICAL CARE IF:   Your symptoms do not get better with medicine.   You have new symptoms.  Document Released: 04/01/2005 Document Revised: 09/26/2013 Document Reviewed: 05/24/2013  [employer] Patient Information 2015 [employer]. This information is not intended to replace advice given to you by your health care provider. Make sure you discuss any questions you have with your health care provider.

## 2015-08-01 LAB — CHOLESTEROL, TOTAL: CHOLESTEROL: 183 mg/dL — AB (ref 125–170)

## 2015-08-01 LAB — HEMOGLOBIN A1C
Hgb A1c MFr Bld: 6.3 % — ABNORMAL HIGH (ref <5.7)
Mean Plasma Glucose: 134 mg/dL — ABNORMAL HIGH (ref <117)

## 2015-08-01 LAB — TRIGLYCERIDES: TRIGLYCERIDES: 116 mg/dL (ref 40–136)

## 2015-08-01 LAB — HDL CHOLESTEROL: HDL: 36 mg/dL (ref 36–76)

## 2015-08-05 ENCOUNTER — Other Ambulatory Visit: Payer: Self-pay | Admitting: Certified Nurse Midwife

## 2015-08-13 ENCOUNTER — Encounter: Payer: Self-pay | Admitting: Dietician

## 2015-08-13 ENCOUNTER — Encounter: Payer: Medicaid Other | Attending: Certified Nurse Midwife | Admitting: Dietician

## 2015-08-13 VITALS — Ht 67.0 in | Wt 238.5 lb

## 2015-08-13 DIAGNOSIS — E669 Obesity, unspecified: Secondary | ICD-10-CM | POA: Insufficient documentation

## 2015-08-13 DIAGNOSIS — Z713 Dietary counseling and surveillance: Secondary | ICD-10-CM | POA: Diagnosis not present

## 2015-08-13 DIAGNOSIS — Z6837 Body mass index (BMI) 37.0-37.9, adult: Secondary | ICD-10-CM | POA: Diagnosis not present

## 2015-08-13 NOTE — Progress Notes (Signed)
Medical Nutrition Therapy:  Appt start time: 1420 end time:  1515.   Assessment:  Primary concerns today: Heidi Turner is here today since she has pre-diabetes, high cholesterol, pcos, and obesity. Is low in Vitamin D. Not taking Vitamin D. Mom and sister are the appointment. Family is on food stamps. Diagnosed with Pre-diabetes (Hgb A1c of 6.3%) about 1 month ago and PCOS in the past couple of weeks. Had been eating mostly salads, fruits, and was cutting out a lot of carbs until they realized they couldn't afford to keep eating that way. No longer drinking soda and no longer like them.   Helps take care of siblings and plans to start online classes soon. Lives with mom, stepdad, and 2 siblings. Mom buys groceries and brother cooks meal most of the time. Skips about 1 meal per day. Not going to eat much recently. Eats meals in living room in front TV.  Preferred Learning Style:   No preference indicated   Learning Readiness:   Ready  MEDICATIONS: metformin   DIETARY INTAKE:  Usual eating pattern includes 2-3 meals and 0-2 snacks per day.  Avoided foods include BBQ, pork chops, oatmeal, grits  24-hr recall:  B ( AM): none or Cinnamon Toast Crunch/Special K with strawberries with whole milk  Snk ( AM): none  L ( PM): none or sandwich with white wheat, ham, and cheese and chips or 2 hotdogs/corndogs Snk ( PM): none or chips D ( PM): hamburger helper or tacos  Snk ( PM): chips, snack cakes Beverages: unsweet tea, water, watered down gatorade, energy drinks  Usual physical activity: none  Estimated energy needs: 2000 calories 225 g carbohydrates 150 g protein 56 g fat  Progress Towards Goal(s):  In progress.   Nutritional Diagnosis:  [place]-3.3 Overweight/obesity As related to hx of meal skipping, large portion sizes, and inadequate physical activity.  As evidenced by BMI of 37.4.    Intervention:  Nutrition counseling provided. Plan: Aim to have 3 meals per day and up to 2 snacks  if you are hungry. Pay attention to hunger/fullness feelings. Fill half of your plates with vegetables at lunch and dinner.  Have lean protein and carbs/starch on a quarter of your plate. Try using small plates to help with portion sizes. Take 20 minutes to eat. Chew 20-30 x per bite. Eat meals at the table with no TV. If you are still hungry after 20 minutes, have seconds. Have snacks with protein and carbs if you are hungry (see list). For breakfast have an yogurt, egg sandwich, small amount cereal with egg, peanut butter sandwich/toast. Limit all sugar in drinks. Consider buying a Brita water filter. Plan to go to the gym every other day. Consider taking a Vitamin D supplement or talk to your doctor about Vitamin D.   Teaching Method Utilized:  Visual Auditory Hands on  Handouts given during visit include:  MyPlate Handout  15 g CHO Snacks  Meal Card  Barriers to learning/adherence to lifestyle change: limited income  Demonstrated degree of understanding via:  Teach Back   Monitoring/Evaluation:  Dietary intake, exercise, and body weight in 1 month(s).

## 2015-08-13 NOTE — Patient Instructions (Addendum)
Aim to have 3 meals per day and up to 2 snacks if you are hungry. Pay attention to hunger/fullness feelings. Fill half of your plates with vegetables at lunch and dinner.  Have lean protein and carbs/starch on a quarter of your plate. Try using small plates to help with portion sizes. Take 20 minutes to eat. Chew 20-30 x per bite. Eat meals at the table with no TV. If you are still hungry after 20 minutes, have seconds. Have snacks with protein and carbs if you are hungry (see list). For breakfast have an yogurt, egg sandwich, small amount cereal with egg, peanut butter sandwich/toast. Limit all sugar in drinks. Consider buying a Brita water filter. Plan to go to the gym every other day. Consider taking a Vitamin D supplement or talk to your doctor about Vitamin D.

## 2015-08-29 ENCOUNTER — Other Ambulatory Visit: Payer: Self-pay | Admitting: Certified Nurse Midwife

## 2015-09-16 ENCOUNTER — Ambulatory Visit: Payer: Medicaid Other | Admitting: Dietician

## 2015-10-29 ENCOUNTER — Ambulatory Visit: Payer: Medicaid Other | Admitting: Certified Nurse Midwife

## 2015-10-31 ENCOUNTER — Ambulatory Visit: Payer: Medicaid Other | Admitting: Certified Nurse Midwife

## 2015-11-05 ENCOUNTER — Ambulatory Visit: Payer: Medicaid Other | Admitting: Certified Nurse Midwife

## 2015-11-07 ENCOUNTER — Ambulatory Visit (INDEPENDENT_AMBULATORY_CARE_PROVIDER_SITE_OTHER): Payer: Medicaid Other | Admitting: Certified Nurse Midwife

## 2015-11-07 ENCOUNTER — Encounter: Payer: Self-pay | Admitting: Certified Nurse Midwife

## 2015-11-07 VITALS — BP 121/81 | HR 108 | Temp 97.8°F | Wt 244.0 lb

## 2015-11-07 DIAGNOSIS — E282 Polycystic ovarian syndrome: Secondary | ICD-10-CM

## 2015-11-07 NOTE — Progress Notes (Signed)
Patient ID: Heidi Turner, female   DOB: [birth year], 19 y.o.   MRN: [id number]   Chief Complaint  Patient presents with  . Follow-up    PCOS    HPI Heidi Turner is a 19 y.o. female.  Here for f/u on PCOS.  States that she is doing well with the OCPs.  Is having regular periods.  Has note been using the vaniquia regularly but states that she is taking the other medications as prescribed.  Is happy with the Sprintec and desires to stay on it.  Blood pressure is stable.  Discussed eating habits.  Is currently staying with her grandmother and helping her out after she feel and broke her leg.  Has not been eating healthy foods.  Mom is still struggling financially.  Has a plan for her to get her GED and for her daughter to start working and going to school.   Encouraged patient to exorcize2.   HPI  Past Medical History  Diagnosis Date  . Prediabetes     History reviewed. No pertinent past surgical history.  History reviewed. No pertinent family history.  Social History Social History  Substance Use Topics  . Smoking status: Never Smoker   . Smokeless tobacco: None  . Alcohol Use: No    No Known Allergies  Current Outpatient Prescriptions  Medication Sig Dispense Refill  . metFORMIN (GLUCOPHAGE) 500 MG tablet Take 1 tablet (500 mg total) by mouth 2 (two) times daily with a meal. 60 tablet 12  . Norgestimate-Ethinyl Estradiol Triphasic (TRI-LO-SPRINTEC) 0.18/0.215/0.25 MG-25 MCG tab Take 1 tablet by mouth daily. 1 Package 11  . spironolactone (ALDACTONE) 50 MG tablet Take 1 tablet (50 mg total) by mouth daily. 30 tablet 12  . buPROPion (WELLBUTRIN) 100 MG tablet Take 100 mg by mouth 2 (two) times daily.    . Eflornithine HCl (VANIQA) 13.9 % cream Apply 1 application topically 2 (two) times daily with a meal. (Patient not taking: Reported on 11/07/2015) 45 g 6   No current facility-administered medications for this visit.    Review of Systems Review of  Systems Constitutional: negative for fatigue and weight loss Respiratory: negative for cough and wheezing Cardiovascular: negative for chest pain, fatigue and palpitations Gastrointestinal: negative for abdominal pain and change in bowel habits Genitourinary:negative Integument/breast: negative for nipple discharge Musculoskeletal:negative for myalgias Neurological: negative for gait problems and tremors Behavioral/Psych: negative for abusive relationship, depression Endocrine: negative for temperature intolerance     Blood pressure 121/81, pulse 108, temperature 97.8 F (36.6 C), weight 244 lb (110.678 kg), last menstrual period 10/30/2015.  Physical Exam Physical Exam General:   alert  Skin:   no rash or abnormalities  Lungs:   clear to auscultation bilaterally  Heart:   regular rate and rhythm, S1, S2 normal, no murmur, click, rub or gallop  Breasts:   normal without suspicious masses, skin or nipple changes or axillary nodes  Abdomen:  normal findings: no organomegaly, soft, non-tender and no hernia  Pelvis:  External genitalia: normal general appearance Urinary system: urethral meatus normal and bladder without fullness, nontender Vaginal: normal without tenderness, induration or masses Cervix: normal appearance Adnexa: normal bimanual exam Uterus: anteverted and non-tender, normal size    100% of 15 min visit spent on counseling and coordination of care.   Data Reviewed Previous medical hx, labs, meds, nutrition consultation  Assessment     PCOS: improvement with OCPs, Metformin and Spironolactone     Plan    No orders of  the defined types were placed in this encounter.   No orders of the defined types were placed in this encounter.     Follow up in 6 months.

## 2016-05-07 ENCOUNTER — Ambulatory Visit: Payer: Medicaid Other | Admitting: Certified Nurse Midwife
